# Patient Record
Sex: Male | Born: 1980 | Hispanic: Yes | Marital: Single | Smoking: Never smoker
Health system: Southern US, Community
[De-identification: ages and names within clinical notes are randomized; demographics above are authoritative.]

## PROBLEM LIST (undated history)

## (undated) HISTORY — PX: HAND SURGERY: SHX662

---

## 2014-07-23 ENCOUNTER — Emergency Department (HOSPITAL_COMMUNITY)
Admission: EM | Admit: 2014-07-23 | Discharge: 2014-07-24 | Disposition: A | Discharge status: Home / Self Care | Payer: Self-pay | Attending: Emergency Medicine | Admitting: Emergency Medicine

## 2014-07-23 ENCOUNTER — Encounter (HOSPITAL_COMMUNITY): Payer: Self-pay | Admitting: *Deleted

## 2014-07-23 DIAGNOSIS — M62830 Muscle spasm of back: Secondary | ICD-10-CM | POA: Insufficient documentation

## 2014-07-23 NOTE — ED Notes (Signed)
ltg flank pain for one week with  nausea

## 2014-07-23 NOTE — ED Provider Notes (Signed)
CSN: [id number]     Arrival date & time 07/23/14  2315 History  This chart was scribed for Todd Jelinski, MD by Evon Slackerrance Branch, ED Scribe. This patient was seen in room B14C/B14C and the patient's care was started at 11:39 PM.      Chief Complaint  Patient presents with  . Flank Pain   Patient is a 34 y.o. male presenting with back pain. The history is provided by the patient. A language interpreter was used.  Back Pain Location:  Lumbar spine and sacro-iliac joint Radiates to:  Does not radiate Pain severity:  Moderate Pain is:  Same all the time Onset quality:  Gradual Duration:  1 week Timing:  Constant Progression:  Unchanged Chronicity:  New Context: lifting heavy objects   Relieved by:  Nothing Worsened by:  Movement Ineffective treatments:  NSAIDs Associated symptoms: no abdominal pain, no abdominal swelling, no bladder incontinence, no bowel incontinence, no chest pain, no dysuria, no fever, no headaches, no leg pain, no numbness, no paresthesias, no pelvic pain, no perianal numbness, no tingling, no weakness and no weight loss   Risk factors: no hx of cancer and no lack of exercise    HPI Comments: Alan Harrington is a 34 y.o. male who presents to the Emergency Department complaining of gradual shooting left sided low back pain onset 1 week prior. Pt rates the severity of the pain 7/10. Pt states that the pain is non radiating. Pt does report heavy lifting while at work. Pt states that the pain is worse with certain positions.  Pt doesn't report any alleviating symptoms. Pt states that he has tried motrin with no relief. Pt denies fever, dysuria, hematuria, diarrhea or constipation.      History reviewed. No pertinent past medical history. History reviewed. No pertinent past surgical history. No family history on file. History  Substance Use Topics  . Smoking status: Never Smoker   . Smokeless tobacco: Not on file  . Alcohol Use: No    Review of Systems   Constitutional: Negative for fever and weight loss.  Cardiovascular: Negative for chest pain.  Gastrointestinal: Negative for abdominal pain, diarrhea, constipation and bowel incontinence.  Genitourinary: Negative for bladder incontinence, dysuria, hematuria and pelvic pain.  Musculoskeletal: Positive for back pain.  Neurological: Negative for tingling, weakness, numbness, headaches and paresthesias.  All other systems reviewed and are negative.    Allergies  Review of patient's allergies indicates no known allergies.  Home Medications   Prior to Admission medications   Not on File   BP 135/80 mmHg  Pulse 78  Temp(Src) 98.1 F (36.7 C)  Resp 16  Wt 208 lb 7 oz (94.547 kg)  SpO2 97%   Physical Exam  Constitutional: He is oriented to person, place, and time. He appears well-developed and well-nourished. No distress.  HENT:  Head: Normocephalic and atraumatic.  Mouth/Throat: Oropharynx is clear and moist. No oropharyngeal exudate.  Eyes: Conjunctivae and EOM are normal. Pupils are equal, round, and reactive to light.  Neck: Normal range of motion. Neck supple. No tracheal deviation present.  Cardiovascular: Normal rate, regular rhythm and normal heart sounds.   Pulmonary/Chest: Effort normal and breath sounds normal. No respiratory distress. He has no wheezes. He has no rales.  Abdominal: Soft. Bowel sounds are normal. He exhibits no mass. There is no tenderness. There is no rebound and no guarding.  Musculoskeletal: Normal range of motion. He exhibits no edema.  Left Paraspinal lumbar spasm.   Lymphadenopathy:  He has no cervical adenopathy.  Neurological: He is alert and oriented to person, place, and time. He has normal reflexes. He displays normal reflexes. No cranial nerve deficit. He exhibits normal muscle tone. Coordination normal.  Skin: Skin is warm and dry.  Psychiatric: He has a normal mood and affect. His behavior is normal.  Nursing note and vitals  reviewed.   ED Course  Procedures (including critical care time) DIAGNOSTIC STUDIES: Oxygen Saturation is 96% on RA, normal by my interpretation.    COORDINATION OF CARE: 11:56 PM-Discussed treatment plan with pt at bedside and pt agreed to plan.     Labs Review Labs Reviewed  COMPREHENSIVE METABOLIC PANEL - Abnormal; Notable for the following:    Glucose, Bld 128 (*)    ALT 83 (*)    Alkaline Phosphatase 127 (*)    All other components within normal limits  CBC WITH DIFFERENTIAL/PLATELET  LIPASE, BLOOD  URINALYSIS, ROUTINE W REFLEX MICROSCOPIC (NOT AT [facility])    Imaging Review Dg Abd Acute W/chest  07/24/2014   CLINICAL DATA:  Right flank pain.  Nausea.  One week duration.  EXAM: DG ABDOMEN ACUTE W/ 1V CHEST  COMPARISON:  None.  FINDINGS: There is no evidence of dilated bowel loops or free intraperitoneal air. No radiopaque calculi or other significant radiographic abnormality is seen. Heart size and mediastinal contours are within normal limits. Both lungs are clear.  IMPRESSION: Negative abdominal radiographs.  No acute cardiopulmonary disease.   Electronically Signed   By: Ellery Plunk M.D.   On: 07/24/2014 00:47     EKG Interpretation None      MDM   Final diagnoses:  None   Muscle spasm.  Highly doubt kidney stone as no urinary symptoms or radiation and started at work.  Pain is somewhat reproducible.  Will treat.     I personally performed the services described in this documentation, which was scribed in my presence. The recorded information has been reviewed and is accurate.       Cy Blamer, MD 07/24/14 [id number]

## 2014-07-24 ENCOUNTER — Emergency Department (HOSPITAL_COMMUNITY): Payer: Self-pay

## 2014-07-24 ENCOUNTER — Encounter (HOSPITAL_COMMUNITY): Payer: Self-pay | Admitting: Emergency Medicine

## 2014-07-24 LAB — URINALYSIS, ROUTINE W REFLEX MICROSCOPIC
BILIRUBIN URINE: NEGATIVE
Glucose, UA: NEGATIVE mg/dL
Hgb urine dipstick: NEGATIVE
KETONES UR: NEGATIVE mg/dL
Leukocytes, UA: NEGATIVE
Nitrite: NEGATIVE
Protein, ur: NEGATIVE mg/dL
SPECIFIC GRAVITY, URINE: 1.024 (ref 1.005–1.030)
Urobilinogen, UA: 1 mg/dL (ref 0.0–1.0)
pH: 6 (ref 5.0–8.0)

## 2014-07-24 LAB — CBC WITH DIFFERENTIAL/PLATELET
BASOS ABS: 0 10*3/uL (ref 0.0–0.1)
Basophils Relative: 0 % (ref 0–1)
Eosinophils Absolute: 0.2 10*3/uL (ref 0.0–0.7)
Eosinophils Relative: 3 % (ref 0–5)
HCT: 44.2 % (ref 39.0–52.0)
HEMOGLOBIN: 15.8 g/dL (ref 13.0–17.0)
LYMPHS PCT: 30 % (ref 12–46)
Lymphs Abs: 2.2 10*3/uL (ref 0.7–4.0)
MCH: 31.5 pg (ref 26.0–34.0)
MCHC: 35.7 g/dL (ref 30.0–36.0)
MCV: 88.2 fL (ref 78.0–100.0)
MONO ABS: 0.6 10*3/uL (ref 0.1–1.0)
MONOS PCT: 8 % (ref 3–12)
NEUTROS PCT: 59 % (ref 43–77)
Neutro Abs: 4.3 10*3/uL (ref 1.7–7.7)
Platelets: 272 10*3/uL (ref 150–400)
RBC: 5.01 MIL/uL (ref 4.22–5.81)
RDW: 13 % (ref 11.5–15.5)
WBC: 7.3 10*3/uL (ref 4.0–10.5)

## 2014-07-24 LAB — COMPREHENSIVE METABOLIC PANEL
ALT: 83 U/L — ABNORMAL HIGH (ref 17–63)
AST: 34 U/L (ref 15–41)
Albumin: 4 g/dL (ref 3.5–5.0)
Alkaline Phosphatase: 127 U/L — ABNORMAL HIGH (ref 38–126)
Anion gap: 10 (ref 5–15)
BUN: 16 mg/dL (ref 6–20)
CO2: 25 mmol/L (ref 22–32)
Calcium: 9.2 mg/dL (ref 8.9–10.3)
Chloride: 105 mmol/L (ref 101–111)
Creatinine, Ser: 0.78 mg/dL (ref 0.61–1.24)
GFR calc Af Amer: >60 mL/min (ref >60)
GFR calc non Af Amer: >60 mL/min (ref >60)
GLUCOSE: 128 mg/dL — AB (ref 65–99)
Potassium: 3.6 mmol/L (ref 3.5–5.1)
Sodium: 140 mmol/L (ref 135–145)
Total Bilirubin: 0.7 mg/dL (ref 0.3–1.2)
Total Protein: 6.9 g/dL (ref 6.5–8.1)

## 2014-07-24 LAB — LIPASE, BLOOD: LIPASE: 30 U/L (ref 22–51)

## 2014-07-24 MED ORDER — ACETAMINOPHEN 500 MG PO TABS
1000.0000 mg | ORAL_TABLET | Freq: Once | ORAL | Status: AC
Start: 2014-07-24 — End: 2014-07-24
  Administered 2014-07-24: 1000 mg via ORAL
  Filled 2014-07-24: qty 2

## 2014-07-24 MED ORDER — PREDNISONE 20 MG PO TABS: ORAL_TABLET | ORAL | Status: DC

## 2014-07-24 MED ORDER — METHOCARBAMOL 500 MG PO TABS
1000.0000 mg | ORAL_TABLET | Freq: Once | ORAL | Status: AC
  Administered 2014-07-24: 1000 mg via ORAL
  Filled 2014-07-24: qty 2

## 2014-07-24 MED ORDER — KETOROLAC TROMETHAMINE 60 MG/2ML IM SOLN
60.0000 mg | Freq: Once | INTRAMUSCULAR | Status: AC
  Administered 2014-07-24: 60 mg via INTRAMUSCULAR
  Filled 2014-07-24: qty 2

## 2014-07-24 MED ORDER — NAPROXEN 375 MG PO TABS: 375.0000 mg | ORAL_TABLET | Freq: Two times a day (BID) | ORAL | Status: DC

## 2014-07-24 MED ORDER — METHOCARBAMOL 500 MG PO TABS: 500.0000 mg | ORAL_TABLET | Freq: Two times a day (BID) | ORAL | Status: DC

## 2014-08-16 ENCOUNTER — Encounter (HOSPITAL_COMMUNITY): Payer: Self-pay | Admitting: Emergency Medicine

## 2014-08-16 ENCOUNTER — Emergency Department (HOSPITAL_COMMUNITY)
Admission: EM | Admit: 2014-08-16 | Discharge: 2014-08-16 | Disposition: A | Discharge status: Home / Self Care | Payer: Self-pay | Attending: Emergency Medicine | Admitting: Emergency Medicine

## 2014-08-16 DIAGNOSIS — Z791 Long term (current) use of non-steroidal anti-inflammatories (NSAID): Secondary | ICD-10-CM | POA: Insufficient documentation

## 2014-08-16 DIAGNOSIS — M5442 Lumbago with sciatica, left side: Secondary | ICD-10-CM | POA: Insufficient documentation

## 2014-08-16 DIAGNOSIS — Z7952 Long term (current) use of systemic steroids: Secondary | ICD-10-CM | POA: Insufficient documentation

## 2014-08-16 MED ORDER — METHOCARBAMOL 500 MG PO TABS: 1000.0000 mg | ORAL_TABLET | Freq: Two times a day (BID) | ORAL | Status: DC

## 2014-08-16 MED ORDER — KETOROLAC TROMETHAMINE 60 MG/2ML IM SOLN
60.0000 mg | Freq: Once | INTRAMUSCULAR | Status: AC
  Administered 2014-08-16: 60 mg via INTRAMUSCULAR
  Filled 2014-08-16: qty 2

## 2014-08-16 MED ORDER — IBUPROFEN 800 MG PO TABS: 800.0000 mg | ORAL_TABLET | Freq: Three times a day (TID) | ORAL | Status: DC

## 2014-08-16 MED ORDER — DIAZEPAM 5 MG PO TABS
5.0000 mg | ORAL_TABLET | Freq: Once | ORAL | Status: AC
  Administered 2014-08-16: 5 mg via ORAL
  Filled 2014-08-16: qty 1

## 2014-08-16 NOTE — ED Provider Notes (Signed)
CSN: [id number]     Arrival date & time 08/16/14  1021 History   This chart was scribed for Alan Mow, PA-C working with No att. providers found by Elveria Rising, ED Scribe. This patient was seen in room TR07C/TR07C and the patient's care was started at 11:58 AM.   Chief Complaint  Patient presents with  . Back Pain  . Leg Pain    The history is provided by the patient. A language interpreter was used.   HPI Comments: Alan Harrington is a 34 y.o. male who presents to the Emergency Department complaining of unimproved left lower back pain with radiation into buttocks and his left leg. Patient is uncertain of causation but shares he works in [employer] and performs repetitive heavy lifting. Patient reports treatment with Motrin once, without relief. Patient denies taking any other medication other than this one dose of Motrin. Patient states that he has been unable to work due to pain severity. Patient denies numbness or weakness in his left leg, saddle paresthesia, bowel or bladder incontinence, fever, nausea, or vomiting. Patient denies personal history of cancer or IV drug use.  Patient evaluated here in [facility] ED 7/1 with complaint of lower left back pain, onset one week prior. Pain was non radiating at the time. Patient diagnosed with muscle spasm and was discharged with Prednisone, Robaxin, and Naproxen.    History reviewed. No pertinent past medical history. History reviewed. No pertinent past surgical history. No family history on file. History  Substance Use Topics  . Smoking status: Never Smoker   . Smokeless tobacco: Not on file  . Alcohol Use: No    Review of Systems  Constitutional: Negative for fever and chills.  Gastrointestinal: Negative for nausea, vomiting and abdominal pain.  Genitourinary: Negative for dysuria and hematuria.  Musculoskeletal: Positive for myalgias and back pain.  Skin: Negative for rash.  Neurological: Negative for weakness and numbness.     Allergies  Review of patient's allergies indicates no known allergies.  Home Medications   Prior to Admission medications   Medication Sig Start Date End Date Taking? Authorizing Provider  ibuprofen (ADVIL,MOTRIN) 800 MG tablet Take 1 tablet (800 mg total) by mouth 3 (three) times daily. 08/16/14   Alan Mow, PA-C  methocarbamol (ROBAXIN) 500 MG tablet Take 2 tablets (1,000 mg total) by mouth 2 (two) times daily. 08/16/14   Alan Mow, PA-C  naproxen (NAPROSYN) 375 MG tablet Take 1 tablet (375 mg total) by mouth 2 (two) times daily. 07/24/14   April Palumbo, MD  predniSONE (DELTASONE) 20 MG tablet 3 tabs po day one, then 2 po daily x 4 days 07/24/14   April Palumbo, MD   Triage Vitals: BP 139/100 mmHg  Pulse 86  Temp(Src) 98.6 F (37 C) (Oral)  Resp 20  SpO2 98% Physical Exam  Constitutional: He is oriented to person, place, and time. He appears well-developed and well-nourished. No distress.  HENT:  Head: Normocephalic and atraumatic.  Eyes: EOM are normal.  Neck: Neck supple. No tracheal deviation present.  Cardiovascular: Normal rate.   Pulmonary/Chest: Effort normal. No respiratory distress.  Musculoskeletal: Normal range of motion.  L5 and S1 spinous and paraspinsous on the left side.   Neurological: He is alert and oriented to person, place, and time. He has normal strength. No cranial nerve deficit or sensory deficit. He displays a negative Romberg sign. Coordination and gait normal. GCS eye subscore is 4. GCS verbal subscore is 5. GCS motor subscore is 6.  Patient fully  alert, answering questions appropriately in full, clear sentences. Cranial nerves II through XII grossly intact. Motor strength 5 out of 5 in all major muscle groups of upper and lower extremities. Distal sensation intact.   Skin: Skin is warm and dry.  Psychiatric: He has a normal mood and affect. His behavior is normal.  Nursing note and vitals reviewed.   ED Course  Procedures (including critical care  time)  COORDINATION OF CARE: 10:59 AM- Discussed treatment plan with patient at bedside and patient agreed to plan.   Labs Review Labs Reviewed - No data to display  Imaging Review No results found.   EKG Interpretation None      MDM   Final diagnoses:  Midline low back pain with left-sided sciatica    Patient with back pain.  This pain has continued since his last visit, however discussing patient's treatment therapies, he has not properly been taking medications, has not been properly performing exercises. I spoke at length with patient about this and made sure that sure language barrier remained clear, and that he had proper instruction on conservative therapies including medication use continuously, as well as heating pad, back exercises. No neurological deficits and normal neuro exam.  Patient can walk but states is painful.  No loss of bowel or bladder control.  No concern for cauda equina.  No fever, night sweats, weight loss, h/o cancer, IVDU.  RICE protocol and pain medicine indicated and discussed with patient at length. Encouraged patient to follow up with PCP. Return precautions discussed, patient verbalizes understanding and agreement of this plan.  I personally performed the services described in this documentation, which was scribed in my presence. The recorded information has been reviewed and is accurate.  BP 126/89 mmHg  Pulse 70  Temp(Src) 98.6 F (37 C) (Oral)  Resp 18  SpO2 99%  Signed,  Alan Mow, PA-C 5:52 PM    Alan Mow, PA-C 08/16/14 1752  Richardean Canal, MD 08/16/14 [phone]

## 2014-08-16 NOTE — ED Notes (Signed)
Pt. Stated, I hurt my back 2 weeks ago, now leg pain (left)

## 2014-08-16 NOTE — Discharge Instructions (Signed)
Citica  (Sciatica)  La citica es Conservation officer, historic buildings, debilidad, entumecimiento u hormigueo a lo largo del nervio citico. El nervio comienza en la zona inferior de la espalda y desciende por la parte posterior de cada pierna. El nervio controla los msculos de la parte inferior de la pierna y de la zona posterior de la rodilla, y transmite la sensibilidad a la parte posterior del muslo, la pierna y la planta del pie. La citica es un sntoma de otras afecciones mdicas. Por ejemplo, un dao a los nervios o algunas enfermedades como un disco herniado o un espoln seo en la columna vertebral, podran daarle o presionar en el nervio citico. Esto causa dolor, debilidad y otras sensaciones normalmente asociadas con la citica. Generalmente la citica afecta slo un lado del cuerpo. CAUSAS   Disco herniado o desplazado.  Enfermedad degenerativa del disco.  Un sndrome doloroso que compromete un msculo angosto de los glteos (sndrome piriforme).  Lesin o fractura plvica.  Embarazo.  Tumor (casos raros). SNTOMAS  Los sntomas pueden variar de leves a muy graves. Por lo general, los sntomas descienden desde la zona lumbar a las nalgas y la parte posterior de la pierna. Ellos son:   Hormigueo leve o dolor sordo en la parte inferior de la espalda, la pierna o la cadera.  Adormecimiento en la parte posterior de la pantorrilla o la planta del pie.  Sensacin de [employer] zona lumbar, la pierna o la cadera.  Dolor agudo en la zona inferior de la espalda, la pierna o la cadera.  Debilidad en las piernas.  Dolor de espalda intenso que [employer] movimientos. Los sntomas pueden empeorar al toser, Brewing technologist, rer o estar sentado o parado durante [employer]. Adems, el sobrepeso puede empeorar los sntomas.  DIAGNSTICO  Su mdico le har un examen fsico para buscar los sntomas comunes de la citica. Le pedir que haga algunos movimientos o actividades que activaran el dolor del nervio  citico. Para encontrar las causas de la citica podr indicarle otros estudios. Estos pueden ser:   Anlisis de Ignacio.  Radiografas.  Pruebas de diagnstico por imgenes, como resonancia magntica o tomografa computada. TRATAMIENTO  El tratamiento se dirige a las causas de la citica. A veces, el tratamiento no es necesario, y Conservation officer, historic buildings y Health and safety inspector desaparecen por s mismos. Si necesita tratamiento, su mdico puede sugerir:   Medicamentos de venta libre para Best boy.  Medicamentos recetados, como antiinflamatorios, relajantes musculares o narcticos.  Aplicacin de calor o hielo en la zona del dolor.  Inyecciones de corticoides para disminuir el dolor, la irritacin y la inflamacin alrededor del nervio.  Reduccin de la [employer] perodos de [place].  Ejercicios y estiramiento del abdomen para fortalecer y Teacher, English as a foreign language la flexibilidad de la columna vertebral. Su mdico puede sugerirle perder peso si el peso extra empeora el dolor de espalda.  Fisioterapia.  La ciruga para eliminar lo que presiona o pincha el nervio, como un espoln seo o parte de una hernia de disco. INSTRUCCIONES PARA EL CUIDADO EN EL HOGAR   Slo tome medicamentos de venta libre o recetados para Glass blower/designer o Health and safety inspector, segn las indicaciones de su mdico.  Aplique hielo sobre el rea dolorida durante 20 minutos 3-4 veces por da durante los primeras 48-72 horas. Luego intente aplicar calor de la misma manera.  Haga ejercicios, elongue o realice sus actividades habituales, si no le causan ms dolor.  Cumpla con todas las sesiones de fisioterapia, segn le  indique su mdico.  Cumpla con todas las visitas de control, segn le indique su mdico.  No use tacones altos o zapatos que no tengan buen apoyo.  Verifique que el colchn no sea muy blando. Un colchn firme Engineer, materials y las [place]. SOLICITE ATENCIN MDICA DE INMEDIATO SI:   Pierde el control de la vejiga o del intestino  (incontinencia).  Aumenta la debilidad en la zona inferior de la espalda, la pelvis, las nalgas o las piernas.  Siente irritacin o inflamacin en la espalda.  Tiene sensacin de ardor al [employer].  El dolor empeora cuando se acuesta o lo despierta por la noche.  El dolor es peor del que experiment en el pasado.  Dura ms de 4 semanas.  Pierde peso sin motivo de [place] sbita. ASEGRESE DE QUE:   Comprende estas instrucciones.  Controlar su enfermedad.  Solicitar ayuda de inmediato si no mejora o si empeora. Document Released: 01/08/2005 Document Revised: 07/10/2011 [facility] Patient Information 2015 [place], [place]. This information is not intended to replace advice given to you by your health care provider. Make sure you discuss any questions you have with your health care provider.  Ejercicios para la espalda (Back Exercises) Estos ejercicios ayudan a tratar y prevenir lesiones en la espalda. El objetivo es aumentar la fuerza de los msculos abdominales y dorsales y la flexibilidad de la espalda. Debe comenzar con estos ejercicios cuando ya no tenga dolor. Los ejercicios para la espalda incluyen:  Inclinacin de la pelvis - Recustese sobre la espalda con las rodillas flexionadas. Incline la pelvis hasta que la parte inferior de la espalda se apoye en el piso. Mantenga esta posicin durante 5 a 10 segundos y repita entre 5 y 10 veces.  Rodilla al pecho - Empuje primero una rodilla contra el pecho y [place] 20 a 30 segundos; repita con la otra rodilla y luego con ambas a la vez. Esto puede realizarlo con la otra pierna extendida o flexionada, del modo en que se sienta ms cmodo.  Abdominales o despegar el cccix del suelo empleando la musculatura abdominal - Flexione las rodillas 90 grados. Comience inclinando la pelvis y realice un ejercicio abdominal lento y parcial, elevando el tronco slo entre 30 y 45 grados del suelo. Emplee al [employer] 2 y 3 segundos para cada  abdominal. No realice los abdominales con las rodillas extendidas. Si le resulta difcil realizar abdominales parciales, simplemente haga lo que se explic anteriormente, pero slo contraiga los msculos abdominales y Buyer, retail tal como se le ha indicado.  Inclinacin de la cadera - Recustese sobre la espalda con las rodillas flexionadas a 90 grados. Empjese con los pies y los hombros mientras eleva la cadera un par de centmetros del suelo, [place] durante 10 segundos y repita entre 5 y 10 veces.  Arcos dorsales - Acustese sobre el [place] e impulse el tronco hacia atrs sobre los codos flexionados. Presione lentamente con las manos, formando un arco con la zona inferior de la espalda. Repita entre 3 y 5 veces. Al realizar las repeticiones, luego de un tiempo disminuirn la rigidez y las [place].  Elevacin de los hombros - Acustese hacia abajo con los brazos a los lados del cuerpo. Presione las caderas y Dance movement psychotherapist torso contra el suelo mientras eleva lentamente la cabeza y los hombros del suelo. No exagere con los ejercicios, especialmente en el comienzo. Los ejercicios pueden causar alguna molestia leve en la espalda durante algunos minutos; sin embargo, si el dolor es [place], o  dura ms de 15 minutos, no siga con la actividad fsica hasta que consulte al profesional que lo asiste. Los problemas en la espalda mejoran de [place] lenta con esta terapia.  Consulte al profesional para que lo ayude a planificar un programa de ejercicios adecuado para su espalda. Document Released: 01/08/2005 Document Revised: 04/02/2011 [facility] Patient Information 2015 [place], [place]. This information is not intended to replace advice given to you by your health care provider. Make sure you discuss any questions you have with your health care provider.   Emergency Department Resource Guide 1) Find a Doctor and Pay Out of Pocket Although you won't have to find out who is covered by your insurance plan, it is a  good idea to ask around and get recommendations. You will then need to call the office and see if the doctor you have chosen will accept you as a new patient and what types of options they offer for patients who are self-pay. Some doctors offer discounts or will set up payment plans for their patients who do not have insurance, but you will need to ask so you aren't surprised when you get to your appointment.  2) Contact Your Local Health Department Not all health departments have doctors that can see patients for sick visits, but many do, so it is worth a call to see if yours does. If you don't know where your local health department is, you can check in your phone book. The CDC also has a tool to help you locate your state's health department, and many state websites also have listings of all of their local health departments.  3) Find a Walk-in Clinic If your illness is not likely to be very severe or complicated, you may want to try a walk in clinic. These are popping up all over the country in pharmacies, drugstores, and shopping centers. They're usually staffed by nurse practitioners or physician assistants that have been trained to treat common illnesses and complaints. They're usually fairly quick and inexpensive. However, if you have serious medical issues or chronic medical problems, these are probably not your best option.  No Primary Care Doctor: - Call Health Connect at  [phone] - they can help you locate a primary care doctor that  accepts your insurance, provides certain services, etc. - Physician Referral Service- [phone]  Chronic Pain Problems: Organization         Address  Phone   Notes  [facility]  [phone] Patients need to be referred by their primary care doctor.   Medication Assistance: Organization         Address  Phone   Notes  [facility] Medication [facility] [address] [place] [place], [place] [address] [phone] --Must be a resident of [facility] -- Must have NO insurance coverage whatsoever (no Medicaid/ Medicare, etc.) -- The pt. MUST have a primary care doctor that directs their care regularly and follows them in the community   MedAssist  [phone]   [employer]  [phone]    Agencies that provide inexpensive medical care: Organization         Address  Phone   Notes  [facility]  [phone]   [facility]    [phone]   [facility] [address] [place], [place] [address] [phone]   [facility] [address] [place], [place] [phone]   Planned Parenthood    (  [phone]   [facility]    [phone]   Community Health and [facility]  [address], [place] Phone:  [phone], Fax:  [phone] Hours of Operation:  9 am - 6 pm, M-F.  Also accepts Medicaid/Medicare and self-pay.  [facility] for [place], [address], [place] Phone: [phone], Fax: [phone]. Hours of Operation:  8:30 am - 5:30 pm, M-F.  Also accepts Medicaid and self-pay.  [facility] [address] [place] Phone: [phone]   [place], [place], [place] [phone], Ext. [phone] Mondays & Thursdays: 7-9 AM.  First 15 patients are seen on a first come, first serve basis.    [place] Providers:  Organization         Address  Phone   Notes  [facility] [address], [place] [phone] Also accepts self-pay patients.  [facility] [address], [place]  [phone]   [place], [address], [place] [phone]   [facility] [address] [place] [phone]   Lucianne Lei [address], [place]   [phone] Only accepts [place] Access [place] patients after they have their name applied to their card.   Self-Pay (no insurance) in [facility]:  Organization         Address  Phone   Notes  Sickle Cell Patients, [facility] [place] [phone]   [facility] [place] [phone]   [facility] [place]  [place], [place], [place] [phone]   [facility]/Dr. Osei-Bonsu  [address], [place] or [address], [place] [phone] Phone number for both [place] and [place] locations is the same.  Urgent Medical and [facility] [address], [place] [phone]   [facility] [address], [place] or [address] [phone]   [facility] [address], [place] [phone], phone; [phone], fax Sees patients 1st and 3rd Saturday of every month.  Must not qualify for public or private insurance (i.e. Medicaid, Medicare, [place] Health Choice, Veterans' Benefits)  Household income should be no more than 200% of the poverty level The clinic cannot treat you if you are pregnant or think you are pregnant  Sexually transmitted diseases are not treated at the clinic.    Dental Care: Organization         Address  Phone  Notes  [facility] Department of [facility] [place] [phone] Accepts children up to age 32 who are enrolled in [place] or [place]; pregnant women with a Medicaid card; and children who have applied for Medicaid or [place] Health Choice, but were declined, whose parents can pay a reduced fee at time of service.  [facility] Department of [facility]  [address], [place] [phone] Accepts children up to age 32 who are enrolled in [place] or [place]; pregnant women with a Medicaid card; and children who have applied for Medicaid or [place], but were declined, whose parents can pay a  reduced fee at time of service.  [facility] Adult [facility] Access PROGRAM  [place] [phone] Patients are seen by appointment only. Walk-ins are not accepted. [place] will see patients 37 years of age and older. Monday - Tuesday (8am-5pm) Most Wednesdays (8:30-5pm) $30 per visit, cash only  [facility] Adult Dental Access PROGRAM  [address], [facility] [phone] Patients are seen by appointment only. Walk-ins are not accepted. [place] will see patients 41 years of age and older. One Wednesday Evening (Monthly: Volunteer Based).  $30 per visit, cash only  [place]  [phone] for adults; Children under age 68, call Graduate Pediatric Dentistry at [phone]. Children aged 44-14, please call [phone] to request a pediatric application.  Dental services are provided in all areas of dental care including fillings, crowns and bridges, complete and partial dentures, implants, gum treatment, root canals, and extractions. Preventive care is also provided. Treatment is provided to both adults and children. Patients are selected via a lottery and there is often a waiting list.   [facility] [address] [place]  [phone] [email or web address]   [facility] [address] [place], [place] [phone], Ext. [phone] Second and Fourth Thursday of each month, opens at 6:30 AM; Clinic ends at 9 AM.  Patients are seen on a first-come first-served basis, and a limited number are seen during each clinic.   [facility]  [address] Hillard Danker [place], [place] [phone]   Eligibility Requirements You must have lived in [place], [place], or [place] counties for at least the last three months.   You cannot be eligible for state or  federal sponsored [employer], including [employer], [place], or [employer].   You generally cannot be eligible for healthcare insurance through your employer.    How to apply: Eligibility screenings are held every Tuesday and Wednesday afternoon from 1:00 pm until 4:00 pm. You do not need an appointment for the interview!  [facility] [address] [place], [place]   [place]  [place] Department  [place]  [phone]    Behavioral Health Resources in the Community: Intensive Outpatient Programs Organization         Address  Phone  Notes  Callery Runnels. [address] [place], [place] [phone]   [facility] Outpatient [address], [place], [place]   ADS: Alcohol & Drug Svcs [address], [place], [place]   [place] [address],  [place], [place] or [phone]   Substance Abuse Resources Organization         Address  Phone  Notes  Alcohol and Drug Services  [phone]   [place]  [phone]   The [place]   Chinita Pester  [phone]   Residential & Outpatient Substance Abuse Program  [phone]   Psychological Services Organization         Address  Phone  Notes  [facility]  [place]  [phone]   [place] [address] [place] or [phone]    Mobile Crisis Teams Organization         Address  Phone  Notes  [facility], Mobile Crisis Care Unit  [phone]  [facility]  [address] [place], [place]   [facility] [address] [place] [phone]    Self-Help/Support Groups Organization         Address  Phone              Notes  [facility] [place] - variety of support groups  [place] Call for more information  Narcotics Anonymous (NA), [facility] [address], [employer] [place]  2 meetings at this location   Special educational needs teacher         Address  Phone  Notes  ASAP Residential Treatment [place],    [place]  [phone]   [facility]  [address], [place] [id number], [place], [place]   [place], [place] [phone] Admissions: 8am-3pm M-F  Incentives Substance [place] [address]    [place], [place] [phone]   [facility] [address] [place], [place], [place]   [facility] [address],  [place], [place]   [facility] Programs - Intensive Outpatient [address]een [address], [place], [place]   [facility] ([place] [place].,  [place], [place] [phone] or [phone]   [facility] Treatment Services (RTS) [address] [place], [place] Accepts Medicaid  [facility] [place] [address]  [place] [phone] Substance Abuse/Addiction Treatment   [facility] Organization         Address  Phone  Notes  [facility]  [phone]   Domenic Schwab, PhD [address] [place]lis [place], [place]   [phone] or [phone]   [place], [place] [phone]   [facility] [address] [place], [place] [phone] Insurance/Medicaid/sponsorship through [facility] and Families [address], [address] [place]                                    [place], [place] [phone] [place] [address], [place] [phone]    Dr. Adele Schilder  [phone]   [facility]  Dept. 1) [address], [place] 2) [place] 3)  [place] [address], [place] [phone]  [phone]   [place] [phone] or [phone] (After Hours)

## 2016-04-03 ENCOUNTER — Other Ambulatory Visit: Payer: Self-pay | Admitting: Infectious Disease

## 2016-04-03 ENCOUNTER — Ambulatory Visit
Admission: RE | Admit: 2016-04-03 | Discharge: 2016-04-03 | Disposition: A | Discharge status: Home / Self Care | Payer: No Typology Code available for payment source | Source: Ambulatory Visit | Attending: Infectious Disease | Admitting: Infectious Disease

## 2016-04-03 DIAGNOSIS — R7611 Nonspecific reaction to tuberculin skin test without active tuberculosis: Secondary | ICD-10-CM

## 2018-09-18 ENCOUNTER — Other Ambulatory Visit: Payer: Self-pay

## 2018-09-18 ENCOUNTER — Emergency Department (HOSPITAL_COMMUNITY)
Admission: EM | Admit: 2018-09-18 | Discharge: 2018-09-18 | Disposition: A | Discharge status: Home / Self Care | Payer: Self-pay | Attending: Emergency Medicine | Admitting: Emergency Medicine

## 2018-09-18 ENCOUNTER — Encounter (HOSPITAL_COMMUNITY): Payer: Self-pay

## 2018-09-18 ENCOUNTER — Emergency Department (HOSPITAL_COMMUNITY): Payer: Self-pay

## 2018-09-18 DIAGNOSIS — Y9389 Activity, other specified: Secondary | ICD-10-CM | POA: Insufficient documentation

## 2018-09-18 DIAGNOSIS — Y999 Unspecified external cause status: Secondary | ICD-10-CM | POA: Insufficient documentation

## 2018-09-18 DIAGNOSIS — W11XXXA Fall on and from ladder, initial encounter: Secondary | ICD-10-CM | POA: Insufficient documentation

## 2018-09-18 DIAGNOSIS — R040 Epistaxis: Secondary | ICD-10-CM | POA: Insufficient documentation

## 2018-09-18 DIAGNOSIS — S0083XA Contusion of other part of head, initial encounter: Secondary | ICD-10-CM | POA: Insufficient documentation

## 2018-09-18 DIAGNOSIS — M542 Cervicalgia: Secondary | ICD-10-CM | POA: Insufficient documentation

## 2018-09-18 DIAGNOSIS — W19XXXA Unspecified fall, initial encounter: Secondary | ICD-10-CM

## 2018-09-18 DIAGNOSIS — Z79899 Other long term (current) drug therapy: Secondary | ICD-10-CM | POA: Insufficient documentation

## 2018-09-18 DIAGNOSIS — S52512A Displaced fracture of left radial styloid process, initial encounter for closed fracture: Secondary | ICD-10-CM | POA: Insufficient documentation

## 2018-09-18 DIAGNOSIS — Y929 Unspecified place or not applicable: Secondary | ICD-10-CM | POA: Insufficient documentation

## 2018-09-18 DIAGNOSIS — R55 Syncope and collapse: Secondary | ICD-10-CM | POA: Insufficient documentation

## 2018-09-18 MED ORDER — ACETAMINOPHEN 325 MG PO TABS
650.0000 mg | ORAL_TABLET | Freq: Once | ORAL | Status: AC
  Administered 2018-09-18: 17:00:00 650 mg via ORAL
  Filled 2018-09-18: qty 2

## 2018-09-18 MED ORDER — OXYCODONE-ACETAMINOPHEN 5-325 MG PO TABS: 1.0000 | ORAL_TABLET | ORAL | 0 refills | Status: AC | PRN

## 2018-09-18 MED ORDER — METHOCARBAMOL 500 MG PO TABS: 500.0000 mg | ORAL_TABLET | Freq: Two times a day (BID) | ORAL | 0 refills | Status: AC

## 2018-09-18 NOTE — ED Provider Notes (Signed)
[facility] EMERGENCY DEPARTMENT Provider Note   CSN: [id number] Arrival date & time: 09/18/18  1528     History   Chief Complaint Chief Complaint  Patient presents with  . Head Injury    HPI Alan Harrington is a 38 y.o. male.     38 y.o male with no PMH presents to the ED s/p fall x1 hour pta. Patient reports he was standing on top of a 6 foot ladder when he went to reach at the ceiling, states he fell forward bracing his fall with his left hand and having his head hit the concrete on the forehead area.He reports a brief period of LOC, states he felt like 'the room went black". He also endorses a sharp pain sensation to his left wrist, states the pain is worse with movement. He has not tried any prior history for relieve in symptoms. He denies any blood thinner use. He denies any other injury.  Prior history of left hand surgery.   The history is provided by the patient and medical records.  Head Injury Associated symptoms: headache and neck pain   Associated symptoms: no seizures and no vomiting     History reviewed. No pertinent past medical history.  There are no active problems to display for this patient.   Past Surgical History:  Procedure Laterality Date  . HAND SURGERY          Home Medications    Prior to Admission medications   Medication Sig Start Date End Date Taking? Authorizing Provider  ibuprofen (ADVIL,MOTRIN) 800 MG tablet Take 1 tablet (800 mg total) by mouth 3 (three) times daily. 08/16/14   Dahlia Bailiff, PA-C  methocarbamol (ROBAXIN) 500 MG tablet Take 1 tablet (500 mg total) by mouth 2 (two) times daily for 7 days. 09/18/18 09/25/18  Janeece Fitting, PA-C  naproxen (NAPROSYN) 375 MG tablet Take 1 tablet (375 mg total) by mouth 2 (two) times daily. 07/24/14   Palumbo, April, MD  oxyCODONE-acetaminophen (PERCOCET/ROXICET) 5-325 MG tablet Take 1 tablet by mouth every 4 (four) hours as needed for up to 3 days for severe pain. 09/18/18 09/21/18  Janeece Fitting, PA-C  predniSONE (DELTASONE) 20 MG tablet 3 tabs po day one, then 2 po daily x 4 days 07/24/14   Randal Buba, April, MD    Family History No family history on file.  Social History Social History   Tobacco Use  . Smoking status: Never Smoker  . Smokeless tobacco: Never Used  Substance Use Topics  . Alcohol use: No  . Drug use: Not on file     Allergies   Patient has no known allergies.   Review of Systems Review of Systems  Constitutional: Negative for chills and fever.  HENT: Negative for ear pain and sore throat.   Eyes: Negative for pain and visual disturbance.  Respiratory: Negative for cough and shortness of breath.   Cardiovascular: Negative for chest pain and palpitations.  Gastrointestinal: Negative for abdominal pain and vomiting.  Genitourinary: Negative for dysuria and hematuria.  Musculoskeletal: Positive for arthralgias and neck pain. Negative for back pain.  Skin: Positive for wound. Negative for color change and rash.  Neurological: Positive for headaches. Negative for seizures, syncope, weakness and light-headedness.  All other systems reviewed and are negative.    Physical Exam Updated Vital Signs BP (!) 149/102 (BP Location: Right Arm)   Pulse 90   Resp 12   Ht 5\' 8"  (1.727 m)   Wt 90.7 kg   SpO2  98%   BMI 30.41 kg/m   Physical Exam Vitals signs and nursing note reviewed.  Constitutional:      Appearance: He is well-developed.  HENT:     Head: Normocephalic.      Comments: Large goose egg to forehead area.     Nose:     Left Nostril: Epistaxis present. No foreign body.     Left Turbinates: Enlarged and swollen.     Right Sinus: No maxillary sinus tenderness or frontal sinus tenderness.     Left Sinus: Frontal sinus tenderness present. No maxillary sinus tenderness.  Eyes:     General: No scleral icterus.    Pupils: Pupils are equal, round, and reactive to light.  Neck:     Musculoskeletal: Normal range of motion.  Cardiovascular:      Heart sounds: Normal heart sounds.  Pulmonary:     Effort: Pulmonary effort is normal.     Breath sounds: Normal breath sounds. No wheezing.  Chest:     Chest wall: No tenderness.  Abdominal:     General: Bowel sounds are normal. There is no distension.     Palpations: Abdomen is soft.     Tenderness: There is no abdominal tenderness.  Musculoskeletal:     Left wrist: He exhibits tenderness, bony tenderness, swelling, crepitus and deformity. He exhibits normal range of motion, no effusion and no laceration.     Comments: Pulses present, capillary refill is intact. Decrease strength with wrist flexion and extension.  Skin:    General: Skin is warm and dry.  Neurological:     Mental Status: He is alert and oriented to person, place, and time.     Comments: Alert, oriented, thought content appropriate. Speech fluent without evidence of aphasia. Able to follow 2 step commands without difficulty.  Cranial Nerves:  II:  Peripheral visual fields grossly normal, pupils, round, reactive to light III,IV, VI: ptosis not present, extra-ocular motions intact bilaterally  V,VII: smile symmetric, facial light touch sensation equal VIII: hearing grossly normal bilaterally  IX,X: midline uvula rise  XI: bilateral shoulder shrug equal and strong XII: midline tongue extension  Motor:  5/5 in upper and lower extremities bilaterally including strong and equal grip strength and dorsiflexion/plantar flexion Sensory: light touch normal in all extremities.  Cerebellar: normal finger-to-nose with bilateral upper extremities, pronator drift negative Gait: normal gait and balance       ED Treatments / Results  Labs (all labs ordered are listed, but only abnormal results are displayed) Labs Reviewed - No data to display  EKG None  Radiology Dg Wrist Complete Left  Result Date: 09/18/2018 CLINICAL DATA:  Left wrist pain after fall EXAM: LEFT WRIST - COMPLETE 3+ VIEW COMPARISON:  None. FINDINGS:  Acute nondisplaced fracture of the radial styloid with intra-articular extension to the radiocarpal joint. Possible nondisplaced triquetral fracture seen on oblique view without displaced fragment on lateral view. Correlate with point tenderness. Chronic posttraumatic deformity of the distal ulna and distal radioulnar joint. Well corticated ulnar styloid fragment, likely sequela of remote trauma. 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 Mild degenerative changes of the triscaphe joint. There is a punctate radiopaque density in the soft tissues adjacent to the first metacarpal diaphysis. Soft tissue swelling at the radial fracture site. IMPRESSION: 1. Acute, nondisplaced intra-articular fracture of the radial styloid. 2. Question nondisplaced triquetral fracture. Correlate with point tenderness. 3. 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 4. Punctate radiopaque  foreign body within the radial soft tissues adjacent to the first metacarpal diaphysis. Electronically Signed   By: Duanne GuessNicholas  Plundo M.D.   On: 09/18/2018 17:12   Ct Head Wo Contrast  Result Date: 09/18/2018 CLINICAL DATA:  Pt was working today and fell off a ladder. The ladder was 6 feet. Pt his hit head. Pt did not lose consciousness. Accident happened an hour ago. Hematoma to left forehead. Pt does not take any blood thinners. Denies any pain, bu.*comment was truncated* EXAM: CT HEAD WITHOUT CONTRAST TECHNIQUE: Contiguous axial images were obtained from the base of the skull through the vertex without intravenous contrast. COMPARISON:  None. FINDINGS: Brain: No evidence of acute infarction, hemorrhage, hydrocephalus, extra-axial collection or mass lesion/mass effect. Vascular: No hyperdense vessel or unexpected calcification. Skull: Normal. Negative for fracture or focal lesion. Sinuses/Orbits: No acute finding. Other: Left frontal scalp soft tissue  hematoma. IMPRESSION: 1. Left frontal scalp soft tissue hematoma. 2. No acute intracranial abnormality. Electronically Signed   By: Emmaline KluverNancy  Ballantyne M.D.   On: 09/18/2018 18:24   Ct Cervical Spine Wo Contrast  Result Date: 09/18/2018 CLINICAL DATA:  Pt was working today and fell off a ladder. The ladder was 6 feet. Pt his hit head. Pt did not lose consciousness. Accident happened an hour ago. Hematoma to left forehead. Pt does not take any blood thinners. Denies any pain, bu.*comment was truncated* EXAM: CT CERVICAL SPINE WITHOUT CONTRAST TECHNIQUE: Multidetector CT imaging of the cervical spine was performed without intravenous contrast. Multiplanar CT image reconstructions were also generated. COMPARISON:  None. FINDINGS: Alignment: Normal. Skull base and vertebrae: No acute fracture. No primary bone lesion or focal pathologic process. Soft tissues and spinal canal: No prevertebral fluid or swelling. No visible canal hematoma. Disc levels:  No significant degenerative changes. Upper chest: Negative. Other: None. IMPRESSION: No acute fracture or static subluxation in the cervical spine. Electronically Signed   By: Emmaline KluverNancy  Ballantyne M.D.   On: 09/18/2018 18:28    Procedures Procedures (including critical care time)  Medications Ordered in ED Medications  acetaminophen (TYLENOL) tablet 650 mg (650 mg Oral Given 09/18/18 1646)     Initial Impression / Assessment and Plan / ED Course  I have reviewed the triage vital signs and the nursing notes.  Pertinent labs & imaging results that were available during my care of the patient were reviewed by me and considered in my medical decision making (see chart for details).       Patient with no pertinent past medical history presents to the ED status post fall from a 6 foot ladder, does have significant hematoma to his left forehead, does endorse left wrist pain worse with movement in wrist extension and flexion.  Neurovascularly intact.  Obvious  deformity noted on left wrist.  He was given Tylenol for pain relief, will obtain CT imaging to further evaluate patient's condition. Xray of his left wrist showed: 1. Acute, nondisplaced intra-articular fracture of the radial  styloid.  2. Question nondisplaced triquetral fracture. Correlate with point  tenderness.  3. Chronic posttraumatic deformity of the distal ulna and DRUJ with  posttraumatic osteoarthritis of the radiocarpal joint.  4. Punctate radiopaque foreign body within the radial soft tissues  adjacent to the first metacarpal diaphysis.     5:26 PM Call placed to orthopedist on call Dr. Romeo AppleHarrison, who recommended splint along with outpatient follow-up in office. CT Head and cervical spine showed: 1. Left frontal scalp soft tissue hematoma.  2. No acute intracranial abnormality.  No acute fracture or static subluxation in the cervical spine.   These results were explained to patient, he was placed on a thumb spica.  He will be provided with pain medication to take at home along with orthopedic follow-up.  Attempted to refer patient to Dr. Fuller CanadaStanley Harrison however patient reports they live in [place] and would like to have an orthopedist near his home.  A referral was given to Dr. Duwayne HeckJason Rogers due to patient's convenience.  Patient is otherwise well-appearing, recommended to continue taking medication to help with his symptoms along with rice therapy.  I will also provide him with a work note.  No signs of hemorrhage, infarct.  Patient with a steady gait discharged from the ED in stable condition.  Return precautions provided at length.   Portions of this note were generated with Scientist, clinical (histocompatibility and immunogenetics)Dragon dictation software. Dictation errors may occur despite best attempts at proofreading.  Final Clinical Impressions(s) / ED Diagnoses   Final diagnoses:  Fall, initial encounter  Displaced fracture of left radial styloid process, initial encounter for closed fracture    ED Discharge  Orders         Ordered    methocarbamol (ROBAXIN) 500 MG tablet  2 times daily     09/18/18 1852    oxyCODONE-acetaminophen (PERCOCET/ROXICET) 5-325 MG tablet  Every 4 hours PRN     09/18/18 1852           Claude MangesSoto, Thea Holshouser, PA-C 09/18/18 1854    Eber HongMiller, Brian, MD 09/19/18 2337

## 2018-09-18 NOTE — Discharge Instructions (Addendum)
Le he recetado medicina para ayudar con su dolor. Tome robaxin para Physiological scientist junto con hielo o un pano caliente a su espalda.   Tambien le he dado una receta de percocet puede tomar esto para Environmental manager.   Tambien puede tomar ibuprofen o tylenol para Teaching laboratory technician.  [place] del [facility] [place] esta es sus papeles, llamelo para hacer una cita.

## 2018-09-18 NOTE — ED Notes (Signed)
Pt verbalized understanding. Computer shut down during signature

## 2018-09-18 NOTE — ED Triage Notes (Addendum)
Pt was working today and fell off a ladder. The ladder was 6 feet. Pt his hit head. Pt did not lose consciousness. Accident happened an hour ago. Hematoma to left forehead. Pt does not take any blood thinners. Denies any pain, but has a burning sensation. Denies dizziness or blurry vision. Pt also complaining of left wrist pain.

## 2018-10-07 ENCOUNTER — Telehealth: Payer: Self-pay | Admitting: Orthopedic Surgery

## 2018-10-07 NOTE — Telephone Encounter (Signed)
Had called right back to patient and scheduled appointment for this week with Dr Aline Brochure; he and family member aware.

## 2018-10-07 NOTE — Telephone Encounter (Signed)
Call received from patient's Lesia Sago. States patient would like to schedule appointment - notes indicate fracture of left radius per visit at [facility] emergency room status post fall. Patient will need interpreter for appointment. [phone]

## 2018-10-10 ENCOUNTER — Ambulatory Visit: Payer: Self-pay | Admitting: Orthopedic Surgery

## 2018-10-10 ENCOUNTER — Other Ambulatory Visit: Payer: Self-pay

## 2018-10-10 ENCOUNTER — Encounter: Payer: Self-pay | Admitting: Orthopedic Surgery

## 2018-10-10 VITALS — BP 141/93 | HR 64 | Ht 68.0 in | Wt 215.0 lb

## 2018-10-10 DIAGNOSIS — S52515A Nondisplaced fracture of left radial styloid process, initial encounter for closed fracture: Secondary | ICD-10-CM

## 2018-10-10 NOTE — Progress Notes (Signed)
Alan Harrington  10/10/2018  HISTORY SECTION :  Chief Complaint  Patient presents with  . Wrist Injury    left 09/18/18 has pain after falling off ladder   The patient presents for evaluation of left wrist fracture 3 weeks ago fell off a ladder.  He has a history of prior surgery on his left wrist 5 years ago left him with some residual radial ulnar arthritis  Pain is located over the radial styloid of the left wrist 3 weeks duration dull mild associated with stiffness   Review of Systems  Musculoskeletal: Positive for neck pain.  Neurological: Negative for tingling, sensory change, focal weakness and weakness.  All other systems reviewed and are negative.  Visit required interpreter.  has no past medical history on file.   Past Surgical History:  Procedure Laterality Date  . HAND SURGERY      Body mass index is 32.69 kg/m.   No Known Allergies  No current outpatient medications on file.   PHYSICAL EXAM SECTION: 1) BP (!) 141/93   Pulse 64   Ht 5\' 8"  (1.727 m)   Wt 215 lb (97.5 kg)   BMI 32.69 kg/m   Body mass index is 32.69 kg/m. General appearance: Well-developed well-nourished no gross deformities  2) Cardiovascular normal pulse and perfusion in the upper extremities normal color without edema  3) Neurologically deep tendon reflexes are equal and normal, no sensation loss or deficits no pathologic reflexes  4) Psychological: Awake alert and oriented x3 mood and affect normal  5) Skin no lacerations or ulcerations no nodularity no palpable masses, no erythema or nodularity  6) Musculoskeletal:   Left wrist previous surgical scars dorsally over the hand and forearm healed tenderness is over the radial styloid and is nontender over the radial ulnar joint He has normal grip strength minimal swelling Decreased range of motion may be chronic No instability   Right wrist nontender  Cervical spine tender with decreased range of motion and  rotation  No upper extremity neurovascular deficits   MEDICAL DECISION SECTION:  Encounter Diagnosis  Name Primary?  . 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

## 2018-10-10 NOTE — Patient Instructions (Signed)
Wear brace for wrist   Take ibuprofen 400 mg 3 x a day for neck

## 2018-11-03 DIAGNOSIS — S52515A Nondisplaced fracture of left radial styloid process, initial encounter for closed fracture: Secondary | ICD-10-CM | POA: Insufficient documentation

## 2018-11-07 ENCOUNTER — Ambulatory Visit: Payer: Self-pay | Admitting: Orthopedic Surgery

## 2018-11-10 ENCOUNTER — Encounter: Payer: Self-pay | Admitting: Orthopedic Surgery

## 2018-11-10 ENCOUNTER — Ambulatory Visit: Payer: Self-pay

## 2018-11-10 ENCOUNTER — Ambulatory Visit (INDEPENDENT_AMBULATORY_CARE_PROVIDER_SITE_OTHER): Payer: Self-pay | Admitting: Orthopedic Surgery

## 2018-11-10 ENCOUNTER — Other Ambulatory Visit: Payer: Self-pay

## 2018-11-10 DIAGNOSIS — S62102D Fracture of unspecified carpal bone, left wrist, subsequent encounter for fracture with routine healing: Secondary | ICD-10-CM

## 2018-11-10 NOTE — Progress Notes (Signed)
Chief Complaint  Patient presents with  . Wrist Injury    09/18/2018 left wrist fracture     Fracture left wrist with previous radial ulnar arthritis as well fracture is healed patient has pain ulnar side of the wrist no pain radial side of the wrist  X-ray shows fracture healing  Patient released  Recommend ibuprofen for arthritis follow-up as needed

## 2018-11-10 NOTE — Patient Instructions (Signed)
ibuprofeno for artritis

## 2020-01-12 IMAGING — CT CT CERVICAL SPINE WITHOUT CONTRAST
5 of 8 series · 12 of 33 positions shown, 13 images · non-contrast
Comparison: None.

CLINICAL DATA: Pt was working today and fell off a ladder. The
ladder was 6 feet. Pt his hit head. Pt did not lose consciousness.
Accident happened an hour ago. Hematoma to left forehead. Pt does
not take any blood thinners. Denies any pain, bu.*comment was
truncated*

EXAM:
CT CERVICAL SPINE WITHOUT CONTRAST
TECHNIQUE: Multidetector CT imaging of the cervical spine was performed without
intravenous contrast. Multiplanar CT image reconstructions were also
generated.

[Series 3: head bone · axial · 0.42mm/px · z∈[+92,+144]mm · 2 of 79 slices shown]
[im 27/79  bone]
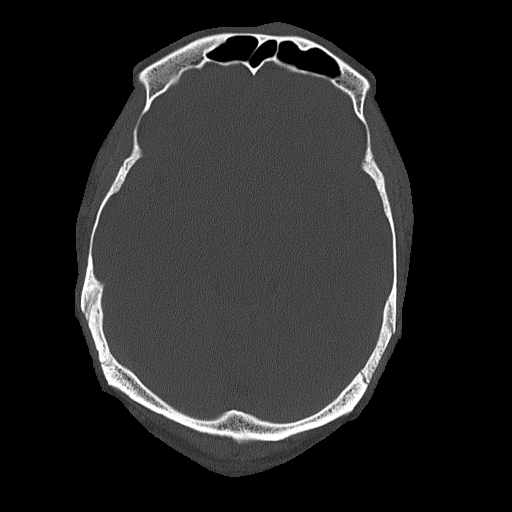
[im 53/79  bone]
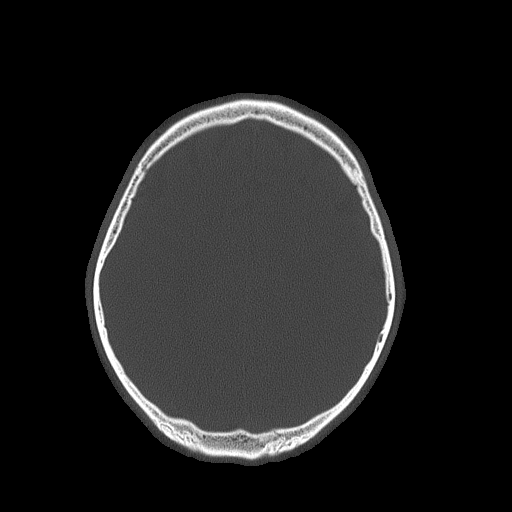

[Series 7: c spine soft · axial · 0.28mm/px · z∈[-76,-14]mm · 2 of 95 slices shown]
[im 32/95  soft-tissue]
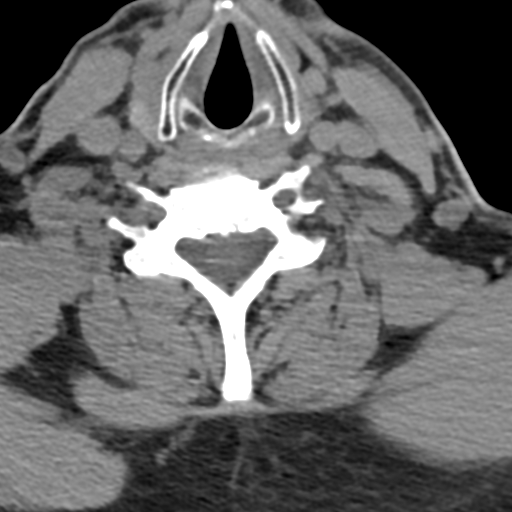
[im 63/95  soft-tissue]
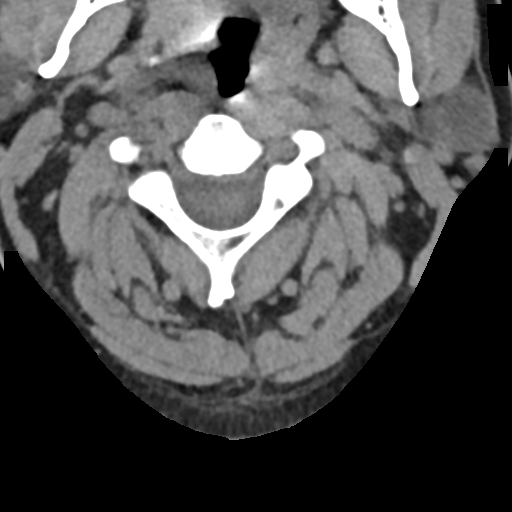

[Series 8: sagittal bone · sagittal · 0.33mm/px · 5 of 61 slices shown]
[im 11/61  bone]
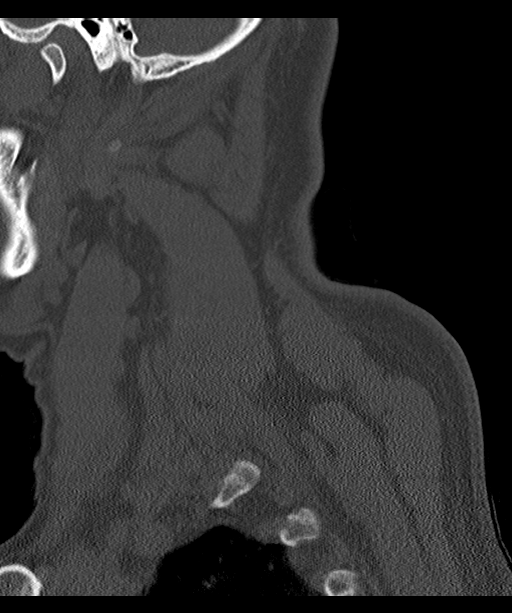
[im 21/61  bone]
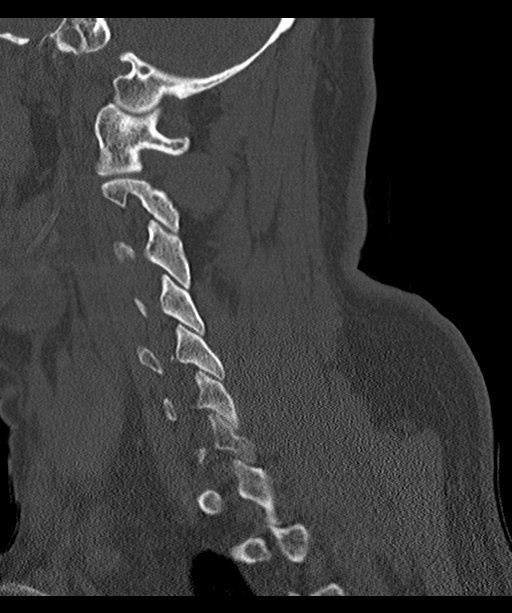
[im 31/61  bone]
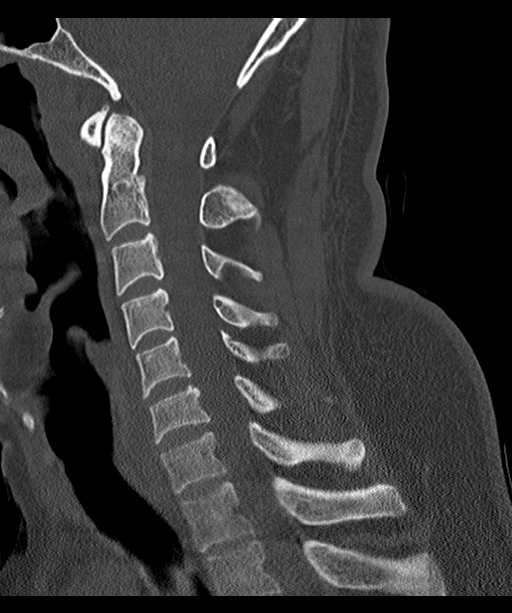
[im 41/61  bone]
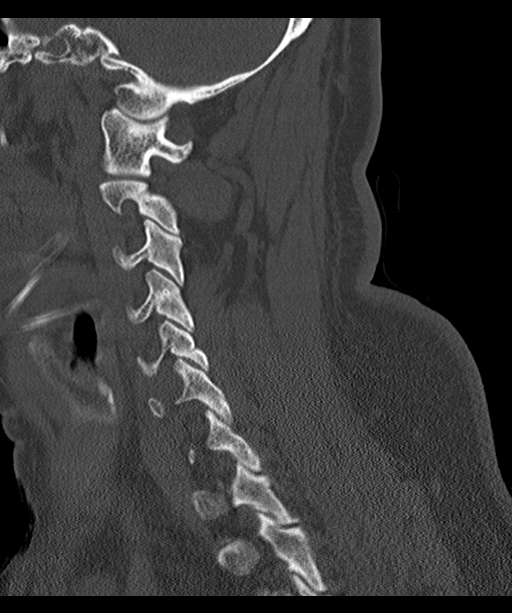
[im 51/61  bone]
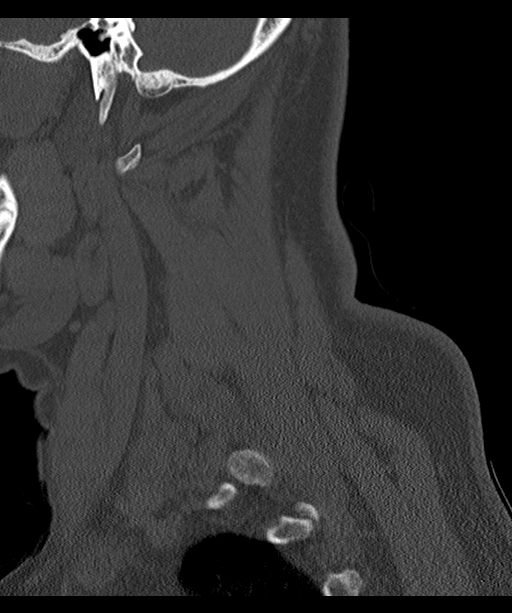

[Series 9: coronal bone · coronal · 0.28mm/px · 1 of 61 slices shown]
[im 31/61  bone]
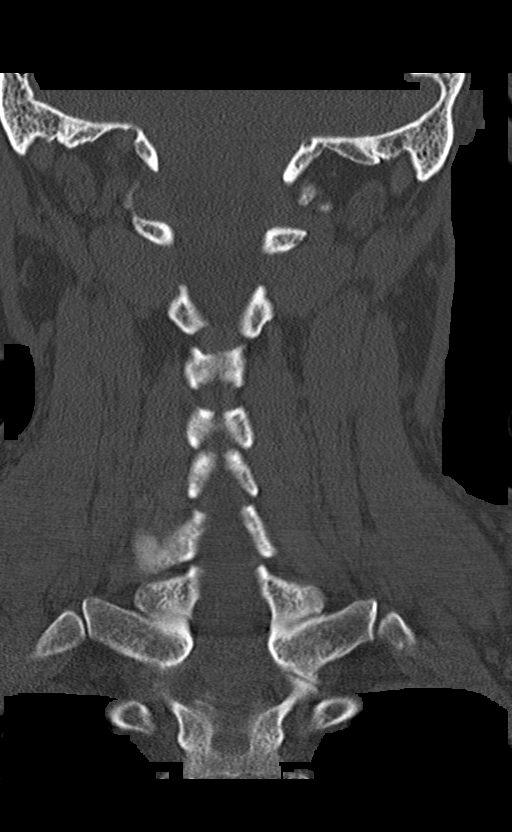

[Series 11: orthogonal axials · axial · 0.21mm/px · z∈[-96,-37]mm · 2 of 91 slices shown, 3 images]
[im 31/91  soft-tissue]
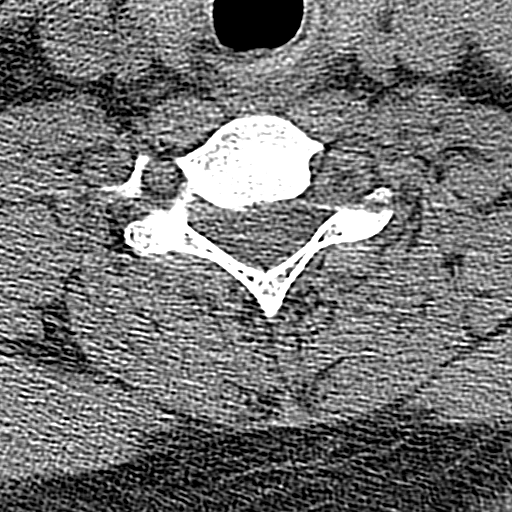
[im 31/91  bone]
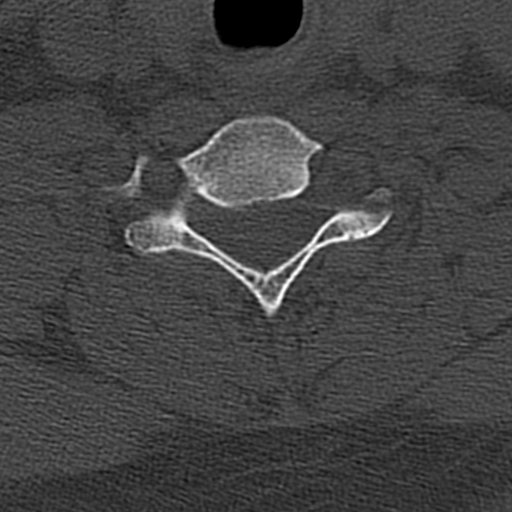
[im 61/91  bone]
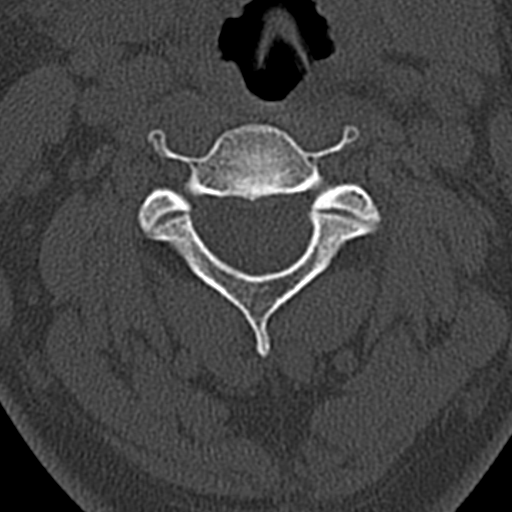

[12 of 33 positions shown; findings below may reference images not displayed]

FINDINGS: Alignment: Normal.

Skull base and vertebrae: No acute fracture. No primary bone lesion
or focal pathologic process.

Soft tissues and spinal canal: No prevertebral fluid or swelling. No
visible canal hematoma.

Disc levels:  No significant degenerative changes.

Upper chest: Negative.

Other: None.
IMPRESSION: No acute fracture or static subluxation in the cervical spine.

## 2021-10-03 ENCOUNTER — Other Ambulatory Visit: Payer: Self-pay

## 2021-10-03 ENCOUNTER — Emergency Department (HOSPITAL_BASED_OUTPATIENT_CLINIC_OR_DEPARTMENT_OTHER)
Admission: EM | Admit: 2021-10-03 | Discharge: 2021-10-03 | Disposition: A | Discharge status: Home / Self Care | Payer: Self-pay | Attending: Emergency Medicine | Admitting: Emergency Medicine

## 2021-10-03 ENCOUNTER — Encounter (HOSPITAL_BASED_OUTPATIENT_CLINIC_OR_DEPARTMENT_OTHER): Payer: Self-pay

## 2021-10-03 DIAGNOSIS — H1032 Unspecified acute conjunctivitis, left eye: Secondary | ICD-10-CM | POA: Insufficient documentation

## 2021-10-03 MED ORDER — FLUORESCEIN SODIUM 1 MG OP STRP
1.0000 | ORAL_STRIP | Freq: Once | OPHTHALMIC | Status: AC
  Administered 2021-10-03: 1 via OPHTHALMIC
  Filled 2021-10-03: qty 1

## 2021-10-03 MED ORDER — TETRACAINE HCL 0.5 % OP SOLN
2.0000 [drp] | Freq: Once | OPHTHALMIC | Status: AC
  Administered 2021-10-03: 2 [drp] via OPHTHALMIC

## 2021-10-03 MED ORDER — FLUORESCEIN SODIUM 1 MG OP STRP
1.0000 | ORAL_STRIP | Freq: Once | OPHTHALMIC | Status: AC
  Administered 2021-10-03: 1 via OPHTHALMIC

## 2021-10-03 MED ORDER — ERYTHROMYCIN 5 MG/GM OP OINT
TOPICAL_OINTMENT | Freq: Once | OPHTHALMIC | Status: AC
  Filled 2021-10-03: qty 7

## 2021-10-03 MED ORDER — TETRACAINE HCL 0.5 % OP SOLN
2.0000 [drp] | Freq: Once | OPHTHALMIC | Status: AC
  Administered 2021-10-03: 2 [drp] via OPHTHALMIC
  Filled 2021-10-03: qty 4

## 2021-10-03 NOTE — ED Provider Notes (Signed)
[facility] EMERGENCY DEPARTMENT Provider Note   CSN: [id number] Arrival date & time: 10/03/21  1904     History  Chief Complaint  Patient presents with   Conjunctivitis    Alan Harrington is a 41 y.o. male.  Patient presents with one week of eye tearing, itching, and foreign body sensation. Eyelid occasionally swollen. Denies fevers. No purulent drainage.  The history is provided by the patient. A language interpreter was used.  Conjunctivitis This is a new problem. The current episode started more than 2 days ago. The problem has not changed since onset.      Home Medications Prior to Admission medications   Not on File      Allergies    Patient has no known allergies.    Review of Systems   Review of Systems  Eyes:  Positive for discharge, redness and itching.  All other systems reviewed and are negative.   Physical Exam Updated Vital Signs BP (!) 138/93 (BP Location: Right Arm)   Pulse 76   Temp 98.4 F (36.9 C) (Oral)   Resp 20   Ht 5\' 8"  (1.727 m)   Wt 104 kg   SpO2 99%   BMI 34.86 kg/m  Physical Exam Vitals and nursing note reviewed.  Constitutional:      Appearance: Normal appearance.  HENT:     Head: Normocephalic.     Mouth/Throat:     Mouth: Mucous membranes are moist.  Eyes:     General: Lids are everted, no foreign bodies appreciated. Vision grossly intact.        Left eye: Discharge present.    Extraocular Movements: Extraocular movements intact.     Conjunctiva/sclera:     Left eye: Left conjunctiva is injected. Exudate present.  Cardiovascular:     Rate and Rhythm: Normal rate and regular rhythm.  Pulmonary:     Effort: Pulmonary effort is normal.     Breath sounds: Normal breath sounds.  Abdominal:     General: Abdomen is flat.  Musculoskeletal:        General: Normal range of motion.  Skin:    General: Skin is warm and dry.  Neurological:     Mental Status: He is alert and oriented to person, place, and  time.  Psychiatric:        Mood and Affect: Mood normal.        Behavior: Behavior normal.     ED Results / Procedures / Treatments   Labs (all labs ordered are listed, but only abnormal results are displayed) Labs Reviewed - No data to display  EKG None  Radiology No results found.  Procedures Procedures    Medications Ordered in ED Medications  tetracaine (PONTOCAINE) 0.5 % ophthalmic solution 2 drop (2 drops Left Eye Given by Other 10/03/21 1920)  fluorescein ophthalmic strip 1 strip (1 strip Left Eye Given by Other 10/03/21 1919)  fluorescein ophthalmic strip 1 strip (1 strip Left Eye Given by Other 10/03/21 2024)  tetracaine (PONTOCAINE) 0.5 % ophthalmic solution 2 drop (2 drops Left Eye Given by Other 10/03/21 2024)  erythromycin ophthalmic ointment ( Left Eye Given 10/03/21 2026)    ED Course/ Medical Decision Making/ A&P                           Medical Decision Making Risk Prescription drug management.   Patient presentation consistent with conjunctivitis.  No evidence of corneal abrasions, entrapment, consensual photophobia, or  herpes keratitis.  Presentation not concerning for iritis, or corneal abrasions.  Pt discharged with erythromycin ointment.  Personal hygiene and frequent handwashing discussed.  Patient advised to follow up with ophthalmologist if symptoms persist or worsen. Return precautions discussed.  Patient verbalizes understanding and is agreeable with discharge.         Final Clinical Impression(s) / ED Diagnoses Final diagnoses:  Acute conjunctivitis of left eye, unspecified acute conjunctivitis type    Rx / DC Orders ED Discharge Orders     None         Felicie Morn, NP 10/03/21 2030    Charlynne Pander, MD 10/03/21 [id number]

## 2021-10-03 NOTE — Discharge Instructions (Signed)
Apply ointment to left eye every 6 hours while awake. Please follow-up with the eye doctor as discussed.

## 2021-10-03 NOTE — ED Triage Notes (Signed)
Pt coming with c/o 1 week eye itching, drainage, and pain. Pt reports that he has intermittent mild blurry vision, but otherwise no other concerns. No fevers, N/V/D. No headache.
